# Patient Record
Sex: Male | Born: 1950 | Race: Black or African American | Hispanic: No | Marital: Married | Smoking: Current every day smoker
Health system: Southern US, Community
[De-identification: ages and names within clinical notes are randomized; demographics above are authoritative.]

## PROBLEM LIST (undated history)

## (undated) DIAGNOSIS — Z87891 Personal history of nicotine dependence: Secondary | ICD-10-CM

## (undated) DIAGNOSIS — I10 Essential (primary) hypertension: Secondary | ICD-10-CM

## (undated) HISTORY — PX: ABOVE KNEE LEG AMPUTATION: SUR20

## (undated) HISTORY — DX: Personal history of nicotine dependence: Z87.891

---

## 2011-03-21 ENCOUNTER — Emergency Department: Payer: Self-pay | Admitting: Emergency Medicine

## 2011-03-21 ENCOUNTER — Ambulatory Visit: Payer: Self-pay | Admitting: Family Medicine

## 2016-03-28 ENCOUNTER — Telehealth: Payer: Self-pay | Admitting: *Deleted

## 2016-03-28 NOTE — Telephone Encounter (Signed)
Received referral for initial lung cancer screening scan. Contacted patient and obtained smoking history as well as answering questions related to screening process. Patient is tentatively scheduled for shared decision making visit and CT scan on 04/01/16 at 1:30pm, pending insurance approval from business office. 

## 2016-03-31 ENCOUNTER — Other Ambulatory Visit: Payer: Self-pay | Admitting: Family Medicine

## 2016-03-31 ENCOUNTER — Encounter: Payer: Self-pay | Admitting: Family Medicine

## 2016-03-31 DIAGNOSIS — Z87891 Personal history of nicotine dependence: Secondary | ICD-10-CM

## 2016-03-31 HISTORY — DX: Personal history of nicotine dependence: Z87.891

## 2016-04-01 ENCOUNTER — Ambulatory Visit
Admission: RE | Admit: 2016-04-01 | Discharge: 2016-04-01 | Disposition: A | Discharge status: Home / Self Care | Payer: Medicaid Other | Source: Ambulatory Visit | Attending: Family Medicine | Admitting: Family Medicine

## 2016-04-01 ENCOUNTER — Inpatient Hospital Stay: Payer: Medicaid Other | Attending: Family Medicine | Admitting: Family Medicine

## 2016-04-01 ENCOUNTER — Encounter: Payer: Self-pay | Admitting: Family Medicine

## 2016-04-01 DIAGNOSIS — Z122 Encounter for screening for malignant neoplasm of respiratory organs: Secondary | ICD-10-CM

## 2016-04-01 DIAGNOSIS — N329 Bladder disorder, unspecified: Secondary | ICD-10-CM | POA: Insufficient documentation

## 2016-04-01 DIAGNOSIS — I2510 Atherosclerotic heart disease of native coronary artery without angina pectoris: Secondary | ICD-10-CM | POA: Insufficient documentation

## 2016-04-01 DIAGNOSIS — K760 Fatty (change of) liver, not elsewhere classified: Secondary | ICD-10-CM | POA: Diagnosis not present

## 2016-04-01 DIAGNOSIS — F17210 Nicotine dependence, cigarettes, uncomplicated: Secondary | ICD-10-CM

## 2016-04-01 DIAGNOSIS — Z87891 Personal history of nicotine dependence: Secondary | ICD-10-CM | POA: Insufficient documentation

## 2016-04-01 DIAGNOSIS — R918 Other nonspecific abnormal finding of lung field: Secondary | ICD-10-CM | POA: Insufficient documentation

## 2016-04-01 NOTE — Progress Notes (Signed)
In accordance with CMS guidelines, patient has meet eligibility criteria including age, absence of signs or symptoms of lung cancer, the specific calculation of cigarette smoking pack-years was 45 years and is a current smoker.   A shared decision-making session was conducted prior to the performance of CT scan. This includes one or more decision aids, includes benefits and harms of screening, follow-up diagnostic testing, over-diagnosis, false positive rate, and total radiation exposure.  Counseling on the importance of adherence to annual lung cancer LDCT screening, impact of co-morbidities, and ability or willingness to undergo diagnosis and treatment is imperative for compliance of the program.  Counseling on the importance of continued smoking cessation for former smokers; the importance of smoking cessation for current smokers and information about tobacco cessation interventions have been given to patient including the  at [facility], [address] [employer] [place], as well as Cancer Center specific smoking cessation programs.  Written order for lung cancer screening with LDCT has been given to the patient and any and all questions have been answered to the best of my abilities.   Yearly follow up will be scheduled by Shawn Perkins, Thoracic Navigator.   

## 2016-04-04 ENCOUNTER — Telehealth: Payer: Self-pay | Admitting: *Deleted

## 2016-04-04 NOTE — Telephone Encounter (Signed)
Notified patient of LDCT lung cancer screening results of Lung Rads 4A  Finding with recommendation for close follow up with either a PET scan or CT in the near future. Will discuss in multidisciplinary conference this week. Dr. Maryruth HancockSallie Patel aware and in agreement of plan. Also notified of incidental finding noted below. Patient verbalizes understanding.   IMPRESSION: 1. Lung-RADS Category 4A, suspicious. Follow up low-dose chest CT without contrast in 3 months (please use the following order, "CT CHEST LCS NODULE FOLLOW-UP W/O CM") is recommended. Alternatively, PET may be considered when there is a solid component 8mm or larger. Part-solid and solid left upper lobe pulmonary nodules as detailed above. 2. Atherosclerosis, including within the coronary arteries. 3. Apparent gastric wall thickening is at least partially due to underdistention. Correlate with any symptoms of gastritis or other gastric pathology. 4. Mild hepatic steatosis. These results will be called to the ordering clinician or representative by the Radiologist Assistant, and communication documented in the PACS or zVision Dashboard.

## 2016-04-07 ENCOUNTER — Telehealth: Payer: Self-pay | Admitting: *Deleted

## 2016-04-07 NOTE — Telephone Encounter (Signed)
Case reviewed in conference and consensus for 3 month follow up scan reviewed with Dr. Maryruth HancockSallie Patel. CT screening program will order and manage his follow up imaging. Reviewed plan with patient who is in agreement with plan.

## 2016-07-06 ENCOUNTER — Telehealth: Payer: Self-pay | Admitting: *Deleted

## 2016-07-06 NOTE — Telephone Encounter (Signed)
Left voicemail for patient notifyng them that it is time to schedule lung cancer screening CT scan follow up imaging. Instructed patient to anticipate call with appt and to call for any questions or problems with the appt.

## 2016-07-21 ENCOUNTER — Other Ambulatory Visit: Payer: Self-pay | Admitting: *Deleted

## 2016-07-21 DIAGNOSIS — R9389 Abnormal findings on diagnostic imaging of other specified body structures: Secondary | ICD-10-CM

## 2016-07-28 ENCOUNTER — Telehealth: Payer: Self-pay | Admitting: *Deleted

## 2016-07-28 NOTE — Telephone Encounter (Signed)
Short term follow up lung cancer screening low dose CT scan is due. Confirmed that patient is within the age range of 55-77, and asymptomatic, (no signs or symptoms of lung cancer). Patient denies illness that would prevent curative treatment for lung cancer if found. The patient is a current smoker, with a 46 pack year history. The shared decision making visit was done 04/01/16. Patient is agreeable for CT scan being scheduled.

## 2016-07-29 ENCOUNTER — Ambulatory Visit
Admission: RE | Admit: 2016-07-29 | Discharge: 2016-07-29 | Disposition: A | Discharge status: Home / Self Care | Payer: Medicaid Other | Source: Ambulatory Visit | Attending: Oncology | Admitting: Oncology

## 2016-07-29 DIAGNOSIS — R918 Other nonspecific abnormal finding of lung field: Secondary | ICD-10-CM | POA: Diagnosis not present

## 2016-07-29 DIAGNOSIS — Z09 Encounter for follow-up examination after completed treatment for conditions other than malignant neoplasm: Secondary | ICD-10-CM | POA: Insufficient documentation

## 2016-07-29 DIAGNOSIS — R938 Abnormal findings on diagnostic imaging of other specified body structures: Secondary | ICD-10-CM | POA: Diagnosis present

## 2016-07-29 DIAGNOSIS — I2510 Atherosclerotic heart disease of native coronary artery without angina pectoris: Secondary | ICD-10-CM | POA: Diagnosis not present

## 2016-07-29 DIAGNOSIS — R9389 Abnormal findings on diagnostic imaging of other specified body structures: Secondary | ICD-10-CM

## 2016-08-08 ENCOUNTER — Telehealth: Payer: Self-pay | Admitting: *Deleted

## 2016-08-08 NOTE — Telephone Encounter (Signed)
Notified patient of LDCT lung cancer screening results with recommendation for 12 month follow up imaging. Also notified of incidental finding noted below. Patient verbalizes understanding.   IMPRESSION: 1. Lung-RADS Category 2, benign appearance or behavior. Continue annual screening with low-dose chest CT without contrast in 12 months. 2. Coronary artery atherosclerosis.

## 2017-07-27 ENCOUNTER — Telehealth: Payer: Self-pay | Admitting: *Deleted

## 2017-07-27 DIAGNOSIS — Z87891 Personal history of nicotine dependence: Secondary | ICD-10-CM

## 2017-07-27 DIAGNOSIS — Z122 Encounter for screening for malignant neoplasm of respiratory organs: Secondary | ICD-10-CM

## 2017-07-27 NOTE — Telephone Encounter (Signed)
Notified patient that annual lung cancer screening low dose CT scan is due currently or will be in near future. Confirmed that patient is within the age range of 55-77, and asymptomatic, (no signs or symptoms of lung cancer). Patient denies illness that would prevent curative treatment for lung cancer if found. Verified smoking history, (current, 47 pack year). The shared decision making visit was done 04/01/16. Patient is agreeable for CT scan being scheduled.

## 2017-08-09 ENCOUNTER — Ambulatory Visit: Payer: Self-pay

## 2017-08-16 ENCOUNTER — Ambulatory Visit: Admission: RE | Admit: 2017-08-16 | Payer: Medicaid Other | Source: Ambulatory Visit

## 2017-08-22 ENCOUNTER — Telehealth: Payer: Self-pay | Admitting: *Deleted

## 2017-08-22 NOTE — Telephone Encounter (Signed)
Contacted patient regarding missed lung screening appointment. Patient desires to reschedule. Please see prior note regarding eligibility information.

## 2017-08-30 ENCOUNTER — Ambulatory Visit
Admission: RE | Admit: 2017-08-30 | Discharge: 2017-08-30 | Disposition: A | Discharge status: Home / Self Care | Payer: Medicaid Other | Source: Ambulatory Visit | Attending: Oncology | Admitting: Oncology

## 2017-08-30 DIAGNOSIS — J439 Emphysema, unspecified: Secondary | ICD-10-CM | POA: Insufficient documentation

## 2017-08-30 DIAGNOSIS — Z87891 Personal history of nicotine dependence: Secondary | ICD-10-CM

## 2017-08-30 DIAGNOSIS — Z122 Encounter for screening for malignant neoplasm of respiratory organs: Secondary | ICD-10-CM | POA: Diagnosis present

## 2017-08-30 DIAGNOSIS — I700 Atherosclerosis of aorta: Secondary | ICD-10-CM | POA: Diagnosis not present

## 2017-08-30 DIAGNOSIS — I2510 Atherosclerotic heart disease of native coronary artery without angina pectoris: Secondary | ICD-10-CM | POA: Insufficient documentation

## 2017-09-04 ENCOUNTER — Encounter: Payer: Self-pay | Admitting: *Deleted

## 2018-06-07 IMAGING — CT CT CHEST LUNG CANCER SCREENING LOW DOSE W/O CM
1 series · 14 of 33 positions shown, 18 images · non-contrast
Comparison: Low-dose lung cancer screening chest CT 07/29/2016.

CLINICAL DATA: 66-year-old male current smoker with 47 pack history
of smoking. Lung cancer screening examination.

EXAM:
CT CHEST WITHOUT CONTRAST LOW-DOSE FOR LUNG CANCER SCREENING
TECHNIQUE: Multidetector CT imaging of the chest was performed following the
standard protocol without IV contrast.

[Series 2: axial st · axial · 0.82mm/px · z∈[-615,-350]mm · 14 of 63 slices shown, 18 images]
[im 5/63  mediastinal]
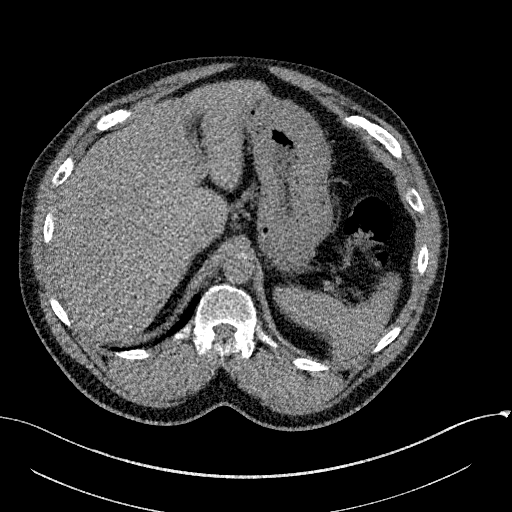
[im 5/63  lung]
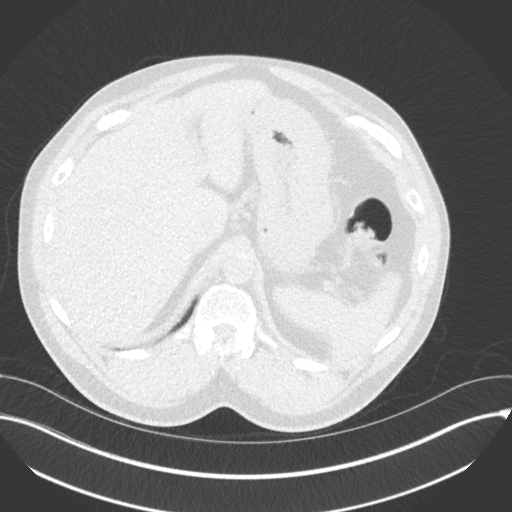
[im 10/63  lung]
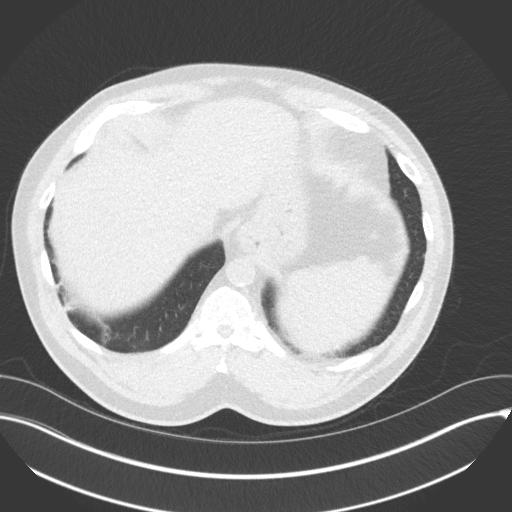
[im 13/63  lung]
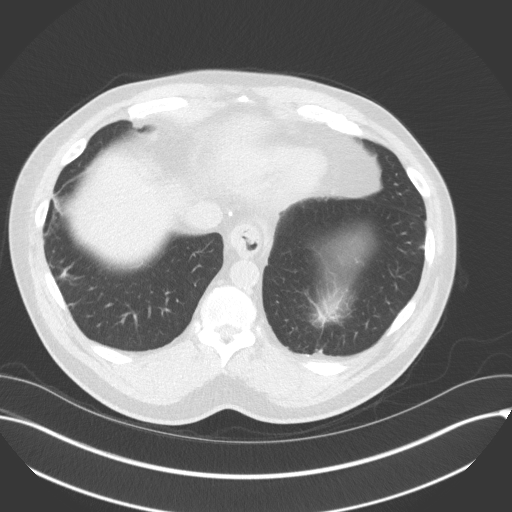
[im 17/63  lung]
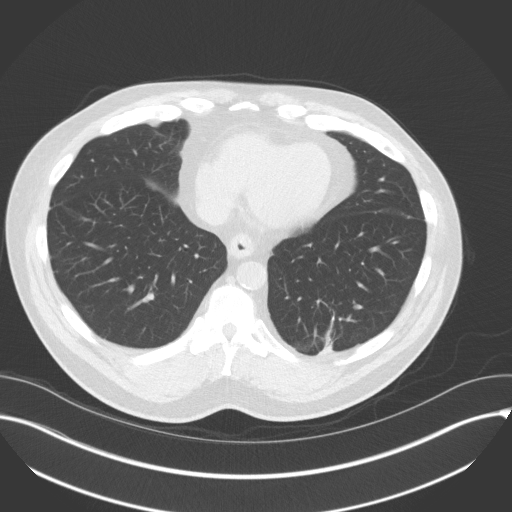
[im 21/63  mediastinal]
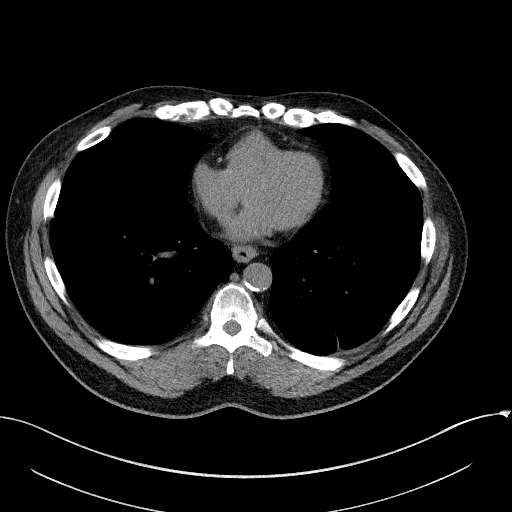
[im 21/63  lung]
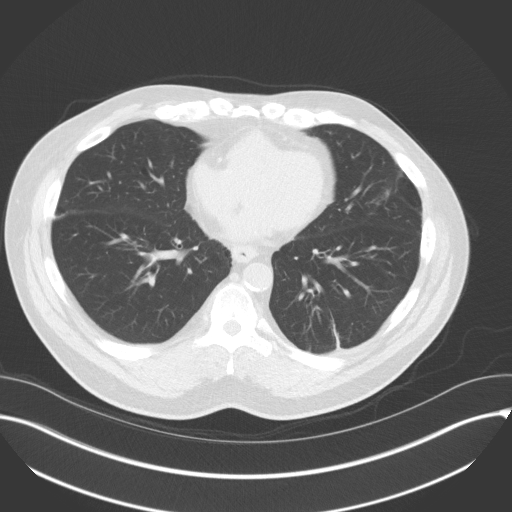
[im 26/63  lung]
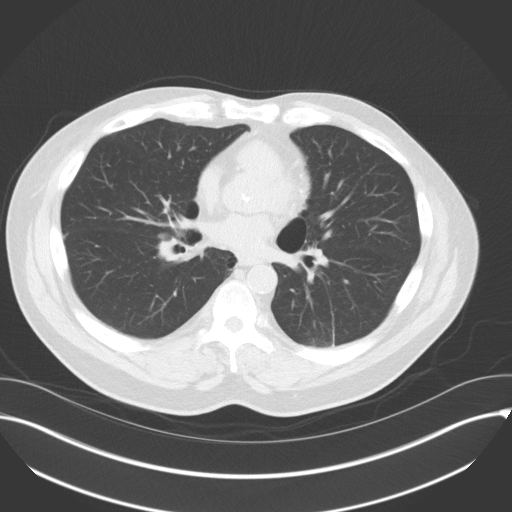
[im 30/63  lung]
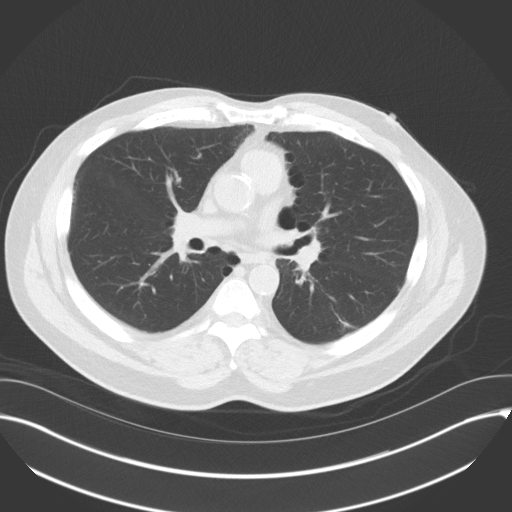
[im 34/63  lung]
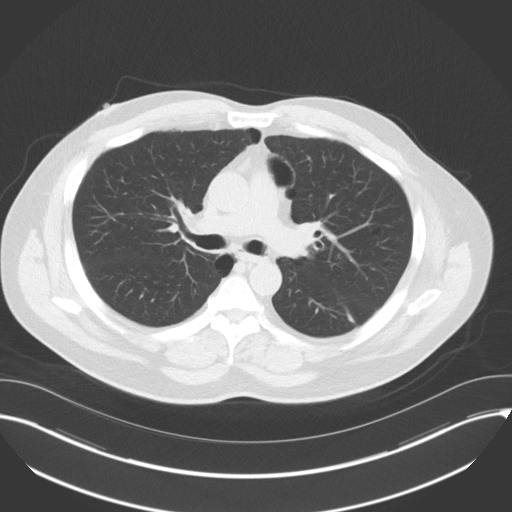
[im 37/63  mediastinal]
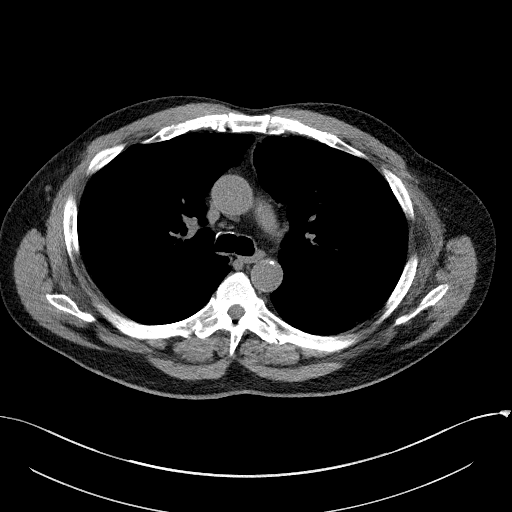
[im 37/63  lung]
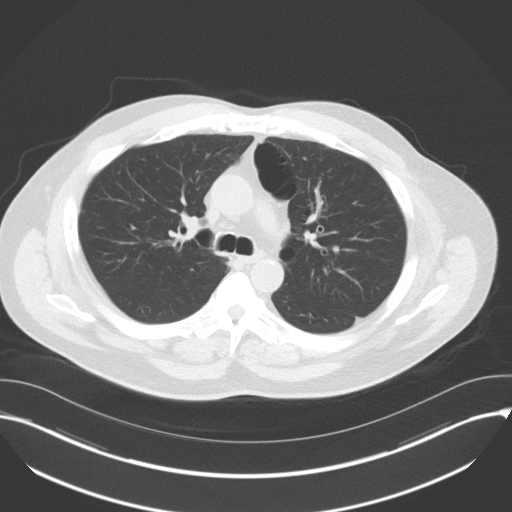
[im 42/63  lung]
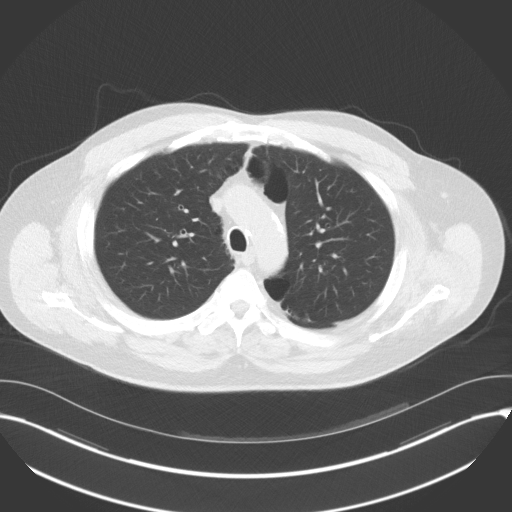
[im 46/63  lung]
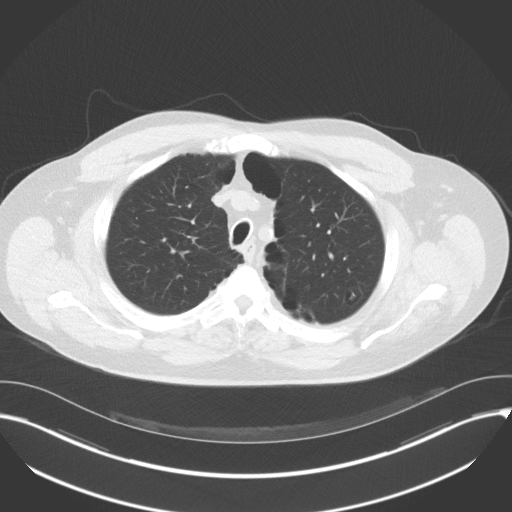
[im 50/63  lung]
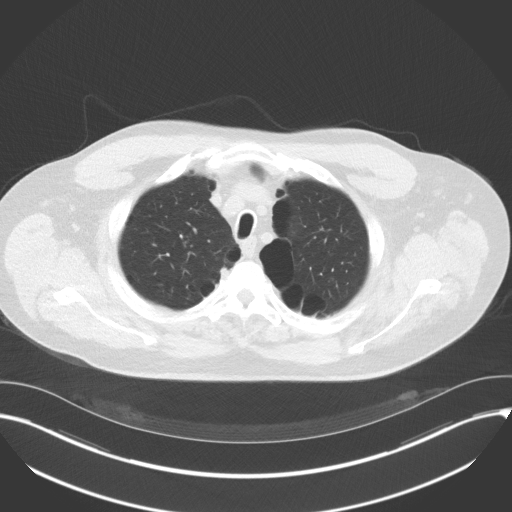
[im 53/63  mediastinal]
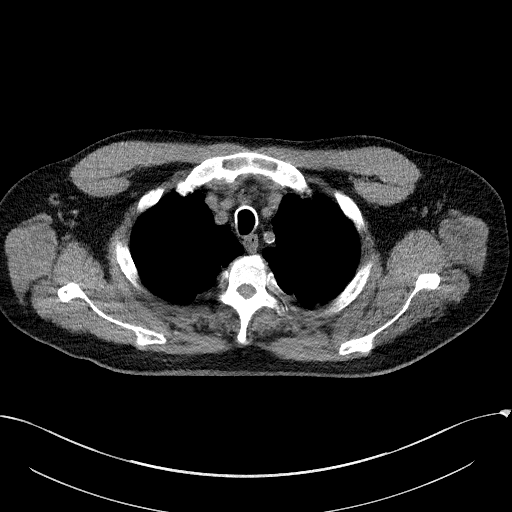
[im 53/63  lung]
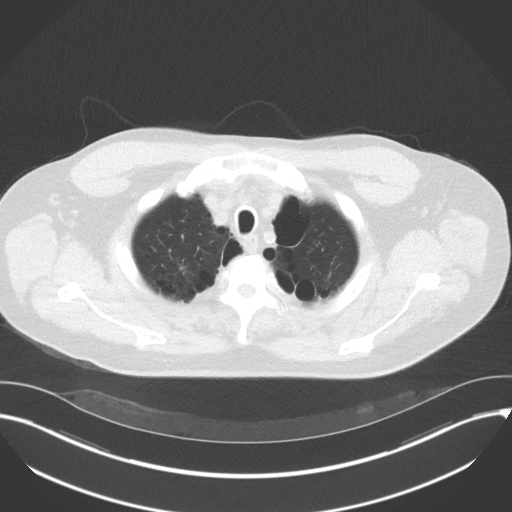
[im 58/63  lung]
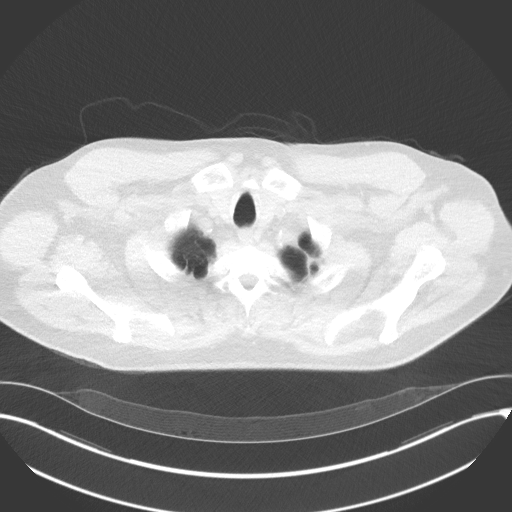

[14 of 33 positions shown; findings below may reference images not displayed]

FINDINGS: Cardiovascular: Heart size is normal. There is no significant
pericardial fluid, thickening or pericardial calcification. There is
aortic atherosclerosis, as well as atherosclerosis of the great
vessels of the mediastinum and the coronary arteries, including
calcified atherosclerotic plaque in the left main, left anterior
descending, left circumflex and right coronary arteries.
Calcifications of the aortic valve.

Mediastinum/Nodes: No pathologically enlarged mediastinal or hilar
lymph nodes. Please note that accurate exclusion of hilar adenopathy
is limited on noncontrast CT scans. Small hiatal hernia. No axillary
lymphadenopathy.

Lungs/Pleura: Multiple pulmonary nodules are again noted throughout
the lungs bilaterally, the largest of which is a mixed solid and sub
solid lesion in the left upper lobe (axial image 78 of series 3)
with a volume derived mean diameter of 15.3 mm for is the
ground-glass attenuation component and 5.7 mm for the solid
component. No other larger more suspicious appearing pulmonary
nodules or masses are noted. No acute consolidative airspace
disease. No pleural effusions. Diffuse bronchial wall thickening
with mild centrilobular and moderate paraseptal emphysema. Linear
scarring in the posterior aspect of the left lower lobe, and
throughout the periphery of the right lower lobe.

Upper Abdomen: Aortic atherosclerosis.

Musculoskeletal: There are no aggressive appearing lytic or blastic
lesions noted in the visualized portions of the skeleton.
IMPRESSION: 1. Lung-RADS 2S, benign appearance or behavior. Continue annual
screening with low-dose chest CT without contrast in 12 months.
2. The "S" modifier above refers to potentially clinically
significant non lung cancer related findings. Specifically, there is
aortic atherosclerosis, in addition to left main and 3 vessel
coronary artery disease. Please note that although the presence of
coronary artery calcium documents the presence of coronary artery
disease, the severity of this disease and any potential stenosis
cannot be assessed on this non-gated CT examination. Assessment for
potential risk factor modification, dietary therapy or pharmacologic
therapy may be warranted, if clinically indicated.
3. Mild diffuse bronchial thickening with mild centrilobular and
moderate paraseptal emphysema; imaging findings suggestive of
underlying COPD.
4. There are calcifications of the aortic valve. Echocardiographic
correlation for evaluation of potential valvular dysfunction may be
warranted if clinically indicated.

Aortic Atherosclerosis (PIKGI-ABK.K) and Emphysema (PIKGI-RCG.F).

## 2018-08-21 ENCOUNTER — Telehealth: Payer: Self-pay | Admitting: *Deleted

## 2018-08-21 ENCOUNTER — Encounter: Payer: Self-pay | Admitting: *Deleted

## 2018-08-21 DIAGNOSIS — Z122 Encounter for screening for malignant neoplasm of respiratory organs: Secondary | ICD-10-CM

## 2018-08-21 NOTE — Telephone Encounter (Signed)
Patient has been notified that the annual lung cancer screening low dose CT scan is due currently or will be in the near future.  Confirmed that the patient is within the age range of 2-80, and asymptomatic, and currently exhibits no signs or symptoms of lung cancer.  Patient denies illness that would prevent curative treatment for lung cancer if found.  Verified smoking history, 53 pkyr history current smoker 1 ppd .  The shared decision making visit was completed on 04-01-16.  Patient is agreeable for the CT scan to be scheduled.  Will call patient back with date and time of appointment.

## 2018-08-22 ENCOUNTER — Telehealth: Payer: Self-pay | Admitting: *Deleted

## 2018-08-22 NOTE — Telephone Encounter (Signed)
Called patient r/t appt for ldct screening on Wednesday 09/05/2018 @ 11:15am here @ [facility], unable to leave a message for patient and appt mailed to patient.

## 2018-09-05 ENCOUNTER — Ambulatory Visit: Payer: Medicare Other | Attending: Oncology

## 2018-09-12 ENCOUNTER — Telehealth: Payer: Self-pay | Admitting: *Deleted

## 2018-09-12 NOTE — Telephone Encounter (Signed)
Attempted to reschedule no show lung screening appt. However, patient did not answer and there is no voicemail option.

## 2018-09-13 ENCOUNTER — Telehealth: Payer: Self-pay | Admitting: *Deleted

## 2018-09-13 ENCOUNTER — Encounter: Payer: Self-pay | Admitting: *Deleted

## 2018-09-13 NOTE — Telephone Encounter (Signed)
Attempted to contact patient r/t LDCT Screening follow up due at this time. Attempted to contact patient r/t LDCT Screening follow up due at this time.  No answer received, unable to leave message at this time, will attempt contact at later date.   

## 2021-07-16 ENCOUNTER — Ambulatory Visit: Payer: Medicare (Managed Care) | Attending: Family Medicine

## 2021-07-16 ENCOUNTER — Other Ambulatory Visit: Payer: Self-pay

## 2021-07-16 DIAGNOSIS — R2681 Unsteadiness on feet: Secondary | ICD-10-CM | POA: Insufficient documentation

## 2021-07-16 DIAGNOSIS — Z89611 Acquired absence of right leg above knee: Secondary | ICD-10-CM | POA: Insufficient documentation

## 2021-07-16 DIAGNOSIS — Z89612 Acquired absence of left leg above knee: Secondary | ICD-10-CM | POA: Diagnosis present

## 2021-07-16 DIAGNOSIS — M6281 Muscle weakness (generalized): Secondary | ICD-10-CM | POA: Insufficient documentation

## 2021-07-16 DIAGNOSIS — R2689 Other abnormalities of gait and mobility: Secondary | ICD-10-CM | POA: Insufficient documentation

## 2021-07-16 NOTE — Therapy (Signed)
[place] MAIN [facility] SERVICES [address] [place], [place], [address] Phone: [phone]   Fax:  [phone]  Physical Therapy Evaluation  Patient Details  Name: Derek Gilbert MRN: [id number] Date of Birth: 1951-06-21 Referring Provider (PT): Elba Barman, MD  Encounter Date: 07/16/2021   PT End of Session - 07/16/21 1758     Visit Number 1    Number of Visits 1    Date for PT Re-Evaluation 07/16/21    PT Start Time 0949    PT Stop Time 1030    PT Time Calculation (min) 41 min    Activity Tolerance Patient tolerated treatment well    Behavior During Therapy [facility] for tasks assessed/performed             Past Medical History:  Diagnosis Date   Personal history of tobacco use, presenting hazards to health 03/31/2016    History reviewed. No pertinent surgical history.  There were no vitals filed for this visit.    Subjective Assessment - 07/16/21 0952     Subjective Pt is a pleasant 70 y/o male presenting for power wheelchair evaluation. 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 Pt had a stroke following one of the amputations while he was still in the hospital that resulted in difficulty with his speech and LHP.  Pt did receive physical therapy in the past as well as LE prostheses, but reports he was unsafe when he attempted to ambulate with the prosthetic legs and that he had difficulty with the equipment locking system. He has not used LE prosthetics in years and reports overall decline in his strength and mobility. Pt has also not stood or ambulated in several years. He reports he was issued a power wheelchair >6 years ago, and that he was able to successfully and safely use it to navigate his home and to complete ADLs/iADLs. Pt reports around 8 months ago his powerchair "broke down," and no longer works d/t equipment/electronics failures. He has been using a manual wheelchair since, but reports increased  difficulty navigating his home environment safely. His wife also reports she must now "push" him in the manual wheelchair as the pt does not have the endurance to propel the manual chair through his house. Pt reports since he started using the manual wheelchair, he has experienced several falls, including 4 in the last 6 months. These falls primarily occur when he attempts to transfer to/from his manual wheelchair. Pt has poor UE endurance and reports LUE pain when using LUE to propel chair. Due to decreased strength/endurance and fatigue he is not always able to make it to his bedside commode on time or safely transfer to it and has accidents of bowel/bladder. He reports he did not have this issue with power chair. He reports pain on his bottom/around sacrum from prolonged sitting in the manual wheelchair with a history of recurring sores on his "tailbone."    Patient is accompained by: Family member   pt's wife present at eval   Pertinent History Pt is a pleasant 70 y/o male presenting to wheelchair evaluation. Pt with B AKA (d/t PVD/clotting per chart) and hx of CVA with left-side hemiparesis. Pt presents in manual wheelchair. He reports he was using a power wheelchair previously but that it broke down as of 8 months ago, d/t electronics failure and charger no longer working.  Other PMH per chart includes HTN, pulmonary embolus (2010), crack cocaine use, PVD, glaucoma with  hx of retinal detachment (2013), shoulder pain, rhabdo (2018), and acute renal insufficiency.    Limitations Sitting;Standing;House hold activities;Walking    How long can you sit comfortably? In manual chair pt able to sit 3-4 hours, but then must lie down d/t fatigue, reports pain around sacrum with sitting in manual wheelchair (hx of wounds).    How long can you stand comfortably? Pt unable to stand    How long can you walk comfortably? Pt unable to walk (B AKA).    Diagnostic tests no recent pertinent diagnostics    Patient Stated  Goals Pt would like a new power wheelchair so he can return to safe mobility in his home    Currently in Pain? No/denies            PATIENT INFORMATION: This Evaluation form will serve as the LMN for the following suppliers:  Supplier: [employer] Contact Person: Yvone Neu Phone: [phone]   Reason for Referral: Patient/caregiver Goals: Patient was seen for face-to-face evaluation for new power wheelchair.  Also present was    Luz Brazen to discuss recommendations and wheelchair options.  Further paperwork was completed and sent to vendor.  Patient appears to qualify for power mobility device at this time per objective findings.   MEDICAL HISTORY: B AKA with hx of CVA with LHP. Other PMH includes HTN, hypercholesterolemia, pulmonary embolus (2010), PVD, B AKA d/t PVD/clotting, glaucoma (hx of retinal detachment 2013), shoulder pain, rhabdo (2018), crack cocaine use, acute renal insufficiency Diagnosis: B AKA with hx of CVA with LHP Primary Diagnosis Onset: Per pt B above knee amputations occurred 9-10 years ago, reports CVA occurred following amputation while still in hospital [x]Progressive Disease B AKA d/t PVD/clotting Relevant Past and Future Surgeries: reports B AKA 9-10 years ago Height: per chart 3'8" Weight: per chart 160 lbs Explain and recent changes or trends in weight:   Relevant History including falls:  Pt is a pleasant 70 y/o male presenting for power wheelchair evaluation. 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 Pt had a stroke following one of the amputations while he was still in the hospital that resulted in difficulty with his speech and LHP.  Pt did receive physical therapy in the past as well as LE prostheses, but reports he was unsafe when he attempted to ambulate with the prosthetic legs and that he had difficulty with the equipment locking system. He has not used LE prosthetics in years and reports overall decline in his  strength and mobility. Pt has also not stood or ambulated in several years. He reports he was issued a power wheelchair >6 years ago, and that he was able to successfully and safely use it to navigate his home and to complete ADLs/iADLs. Pt reports around 8 months ago his powerchair "broke down," and no longer works d/t equipment/electronics failures. He has been using a manual wheelchair since, but reports increased difficulty navigating his home environment safely. His wife also reports she must now "push" him in the manual wheelchair as the pt does not have the endurance to propel the manual chair through his house. Pt reports since he started using the manual wheelchair, he has experienced several falls, including 4 in the last 6 months. These falls primarily occur when he attempts to transfer to/from his manual wheelchair. Pt has poor UE endurance and reports LUE pain when using LUE to propel chair. Due to decreased strength/endurance and fatigue he is not always able to make it  to his bedside commode on time or safely transfer to it and has accidents of bowel/bladder. He reports he did not have this issue with power chair. He reports pain on his bottom/around sacrum from prolonged sitting in the manual wheelchair with a history of recurring sores on his "tailbone."    HOME ENVIRONMENT: [x]House  []Condo/town home  []Apartment  []Assisted Living    []Lives Alone [x] Lives with Others                                                    Hours with caregiver: lives with his wife who is his caregiver  [x]Home is accessible to patient            Stairs  []Yes [] No     Ramp [x]Yes []No Comments:  Ramp entrance in front of house, was able to successfully use previous power WC in his home   COMMUNITY ADL: TRANSPORTATION: [x]Car    []Van    []Public Transportation    []Adapted w/c Lift   []Ambulance   []Other:       []Sits in wheelchair during transport  Employment/School:   on disability, needs  transport for doctor's appointments, participating in the community  Specific requirements pertaining to mobility                                                     Other: Spouse reports they have "regular car" for transportation. Spouse drives. She reports she transfers pt in and out of car.                   FUNCTIONAL/SENSORY PROCESSING SKILLS:  Handedness:   [x]Right     []Left    []NA  Comments:                                 Media planner for Wheeled Mobility [x]Processing Skills are adequate for safe wheelchair operation  Areas of concern than may interfere with safe operation of wheelchair Description of problem   [] Attention to environment     []Judgment     [] Hearing  [x] Vision or visual processing    []Motor Planning  [] Fluctuations in Behavior                                                Pt reports he does need glasses for reading, but this does not interfere with ability to safely use WC and has not interfered in the past with using previous power WC   VERBAL COMMUNICATION: [x]WFL receptive [x] WFL expressive []Understandable  [x]Difficult to understand  []non-communicative [] Uses an augmented communication device   Pt is somtimes difficult to understand. His speech was affected by stroke. However, pt ability to understand/express himself is WNL.    CURRENT SEATING / MOBILITY: Current Mobility Base:   []None  []Dependent  [x]Manual  []Scooter  []Power   Type of Control:  attempts to self-propel manual  chair, but with difficulty and reports LUE pain in elbow/UE fatigue. Pt's wife now "pushes him" in chair to assist him navigating their home.                 Manufacturer:                         Size:                         Age:                           Current Condition of Mobility Base:                                                                                                                     Current Wheelchair components: See  comment above for detail. Pt had power chair previously (reports >25 years old), but it is no longer functional/broke down 8 months ago. Currently attempts to use manual wheelchair. Manual wheelchair with nonfunctioning breaks, insufficient for pt to perform pressure relief to prevent recurrent pressure wounds. Current manual chair unsafe regarding fit and d/t pt poor endurance/UE pain with use. Manual chair limits pt's ability to independently and safely navigate his home, and inability to perform proper pressure relief places pt at increased risk of hospitalization d/t skin wounds.   Describe posture in present seating system:    sacral sitting, increased forward head posture, pt appears at risk for sliding out of chair and reports hx of falls from manual chair with transfers                                                                     SENSATION and SKIN ISSUES: Sensation [x]Intact []Impaired []Absent   Level of sensation:                           Pressure Relief: Able to perform effective pressure relief :   []Yes  [x] No Method:                                                                              If not, Why?:  Pt with insufficient UE endurance for maintaining pressure relief position for long enough time, as evidenced by hx of recurrent skin breakdown with wounds developing on his sacrum  He reports this was not an issue when he had previous power WC.                                                                Skin Issues/Skin Integrity Current Skin Issues   []Yes [x]No  []Intact [] Red area [] Open Area  []Scar Tissue [x]At risk from prolonged sitting  Where hx of wounds on sacrum                         History of Skin Issues   [x]Yes []No  Where    sacrum                                     When  within past several months per pt and pt's spouse                                             Hx of skin flap surgeries []Yes []No  Where                  When                                                   Limited sitting tolerance [x]Yes, limited secondary to fatigue, pressure/pain on bottom/wound risk []No Hours spent sitting in wheelchair daily:                                                         Complaint of Pain:  Please describe:  Pt reports pain on his bottom from sitting in manual wheelchair (reports hx of wounds), reports L elbow pain with propelling WC/using LUE, pt reports phantom pain (feels pain in B feet), he transfers out of chair after 3-4 hours to lie down                                                                                                           Swelling/Edema:        none per observation  ADL STATUS (in reference to wheelchair use):  Indep Assist Unable Indep with Equip Not assessed Comments  Dressing       *mod I   x                                                Pt reports he is able to do without assist from spouse, but that it takes him more time          Eating  x                                                                  Indep with self-feeding but does require assist for meal prep                                                          Toileting                     x                                              Uses bedside commode.       Unsafe in current manual chair, reports falls when attempting transfers, is unable to make it to commode on time                                                       Bathing                  x                                                  Bathes in the kitchen, pt states "my arms slide a lot when trying to take a bath"                                                                   Grooming/ Hygiene                 x  Meal Prep                  x                                                                                                         IADLS                   x                                                                                               Bowel Management: []Continent  []Incontinent  [x]Accidents Comments:  Due to safety issues/difficulty transferring from manual chair and propelling manual WC, pt not always able to make it to commode on time, increasing risk for wounds/infection and hospitalization                                                Bladder Management: []Continent  []Incontinent  [x]Accidents Comments: Due to safety issues/difficulty transferring from manual chair and propelling manual WC, pt not always able to make it to commode on time, increasing risk for wounds/infection and hospitalization                                                WHEELCHAIR SKILLS: Manual w/c Propulsion: []UE or LE strength and endurance sufficient to participate in ADLs using manual wheelchair Arm :  []left []right  []Both                                   Foot:   []left []right  []Both  Distance: Limited in his home. Pt with insufficient UE strength/endurance for manual propulsion, wife is helping him/pushing chair. Also reports chronic LUE pain with use of LUE. Pt with B AKA, so does not have ability to utilize lower body for WC propulsion.   Operate Scooter: [] Strength, hand grip, balance and transfer appropriate for use []Living environment is accessible for use of scooter  Operate Power w/c:  [x] Std. Joystick   [] Alternative Controls Indep [x] Assist [] Dependent/ Unable [] N/A [] []Safe          [] Functional      Distance:                Bed confined without wheelchair [x]  Yes [] No   STRENGTH/RANGE OF MOTION:  Range of Motion Strength  Shoulder                                Impaired, achieves approx. 105-115 deg. Abduction/flexion B                          4-/5 B                                   Elbow                        WFL                                  4/5     B; painful LUE                                                 Wrist/Hand                     WFL                                                           4/5     B                                                    Hip     Unable to formally assess as pt is unable to stand d/t B AKA. Hip flexion ROM appears WFL                                                                      4-/5  B                                                Knee        N/a d/t B AKA                                                        Above knee amputation  Ankle N/a d/t B AKA                       Above knee amputation                                                 MOBILITY/BALANCE:  [] Patient is totally dependent for mobility                                                                                               Balance Transfers Ambulation  Sitting Balance: Standing Balance: [] Independent [] Independent/Modified Independent  [] WFL     [] [facility] [] Supervision [] Supervision  [x] Uses UEs to assist with seated balance, PT provides close CGA when assessing seated balance as pt with poor UE/trunk strength that increases risk of fall from [facility]  [] Supervision [] Min Assist [] Ambulates with Assist                           [] Min Assist [] Min assist [] Mod Assist [] Ambulates with Device:  [] RW   [] StW   [] Cane   []                [] Mod Assist [] Mod assist [x] Max assist;  Not formally tested, however, per reports insufficient UE strength for safe mod I transfers. Unsafe transferring from manual WC, has fallen 4x in past 6 months attempting transfers. Pt wife helps pt transfer into/out of car   [] Max Assist [] Max assist [] Dependent [] Indep. Short Distance Only  [] Unable [x] Unable [] Lift / Sling Required Distance (in feet)                             [] Sliding  board [x] Unable to Ambulate: (Explain: PT B AKA. Reports in past did have LE prosthetics, but were not safe/he was unsafe when attempting to use, reports did not lock properly. Now pt with poor hip/LE strength. Has not used LE prosthetics for 2-3 years, has not stood or walked in 2-3 years  Cardio Status:  []Intact  [] Impaired   [x] NA                              Respiratory Status:  []Intact   []Impaired   [x]NA                                     Orthotics/Prosthetics:                     does not use (has not used in 2-3 years), pt reports he was not able to ambulate with them  safely and that they did not "lock" correctly  Comments (Address manual vs power w/c vs scooter):       Pt is a pleasant 70 y/o male with hx of B AKA and CVA with L side weakness. Pt has not stood or ambulated in 2-3 years, and reports decline in strength and endurance. Pt with previous power wheelchair that he was successfully using to safely navigate his home up until 8 months ago when his power chair broke down d/t Tour manager failure. Pt now uses manual wheelchair, but his wife must help "push" him throughout their home. The manual wheelchair has also proven to be an unsafe option for the pt, as the pt has insufficient UE strength/endurance and reports LUE pain with trying to propel the manual wheelchair in his home. Manual wheelchair does not provide enough safety features, as pt also has impaired seated balance and shows increased reliance on UEs to maintain balance in his WC with testing. He has fallen 4 times in the last 6 months from the manual wheelchair when attempting transfers, limiting his ability to safely perform ADLs. Overall, he has insufficient upper body strength for proper pressure-relief techniques in the manual WC. This has resulted in recurring wounds developing on his sacrum, placing him at increased risk for hospitalization and poor outcomes. Due to difficulty with propelling the manual WC, pt  has been having difficulty making it to his commode on time and reports accidents of bowel and bladder, which also increases risk for skin wounds, infection and future hospitalization. He reports he did not have this problem when he had a power WC as he was able to make it to his commode on time and safely transfer. D/t difficulty with pressure-relief techniques pt has pain on his bottom which limits his time he can use his current manual wheelchair. Poor UE endurance and LUE elbow pain also limit his ability to propel his WC. Due to the nature of the pt's condition, hx of multiple falls from his manual WC, poor overall strength, endurance, hx of wounds, and pain, the pt will require a power chair.                                                             Anterior / Posterior Obliquity Rotation-Pelvis  PELVIS    []Neutral  [x] Posterior  [] Anterior     [x]WFL  []Right Elevated  []Left Elevated   [x]WFL  []Right Anterior []  Left Anterior    [] Fixed [] Partly Flexible [x] Flexible  [] Other  [] Fixed  [x] Partly Flexible  [] Flexible [] Other  [] Fixed  [x] Partly Flexible  [] Flexible [] Other  TRUNK []WFL [x]Thoracic Kyphosis []Lumbar Lordosis   [x] WFL []Convex Right []Convex Left   []c-curve []s-curve []multiple  [x] Neutral [] Left-anterior [] Right-anterior    [] Fixed [] Flexible [x] Partly Flexible       Other  [] Fixed [] Flexible [x] Partly Flexible [] Other  [] Fixed           [] Flexible [x] Partly Flexible [] Other   Position Windswept   HIPS  [x] Neutral [] Abduct [] ADduct [x] Neutral [] Right [] Left       [] Fixed  [  x] Partly Flexible             [] Dislocated [] Flexible [] Subluxed    [] Fixed [x] Partly Flexible  [] Flexible [] Other              Foot Positioning Knee Positioning   Knees and  Feet  [] [facility] []Left []Right N/A pt with B AKA [] WFL []Left []Right   KNEES ROM concerns: ROM  concerns:   & Dorsi-Flexed                    []Lt []Rt                                  FEET Plantar Flexed                  []Lt []Rt     Inversion                    []Lt []Rt     Eversion                    []Lt []Rt    HEAD [x] Functional [x] Good Head Control   & [x] Flexed         [] Extended [] Adequate Head Control   NECK [] Rotated  Lt  [] Lat Flexed Lt [] Rotated  Rt [] Lat Flexed Rt [] Limited Head Control    [] Cervical Hyperextension [] Absent  Head Control    SHOULDERS ELBOWS WRIST& HAND         Left     Right    Left     Right  U/E []Functional  Left            []Functional  Right      [facility]                [facility]           []Fisting             []Fisting     [x]elevated Left []depressed  Left [x]elevated Right []depressed  Right   wfl   []protracted Left []retracted Left []protracted Right []retracted Right []subluxed  Left              []subluxed  Right         Goals for Wheelchair Mobility  [x] Independence with mobility in the home with motor related ADLs (MRADLs)  [x] Independence with MRADLs in the community [] Provide dependent mobility  [] Provide recline     [x]Provide tilt   Goals for Seating system [x] Optimize pressure distribution [x] Provide support needed to facilitate function or safety [] Provide corrective forces to assist with maintaining or improving posture [] Accommodate client's posture: current seated postures and positions are not flexible or will not tolerate corrective forces [x] Client to be independent with relieving pressure in the wheelchair [x]Enhance physiological function such as breathing, swallowing, digestion  Simulation ideas/Equipment trials:                                   State why other equipment was  unsuccessful:                      Pt is a pleasant  70 y/o male with hx of B AKA and CVA with L side weakness per chart. Pt has not stood or ambulated in 2-3 years, and reports decline in strength and endurance. Pt with  previous power wheelchair that he was successfully using to safely navigate his home up until 8 months ago when his power chair broke down d/t Tour manager failure. Pt now uses manual wheelchair, but his wife must help "push" him throughout their home. The manual wheelchair has also proven to be an unsafe option for the pt, as the pt has insufficient UE strength/endurance and reports LUE pain with trying to propel the manual wheelchair in his home. Manual wheelchair does not provide enough safety features, as pt also has impaired seated balance and shows increased reliance on UEs to maintain balance in his WC with testing. He has fallen 4 times in the last 6 months from the manual wheelchair when attempting transfers, limiting his ability to safely perform ADLs. Overall, he has insufficient upper body strength for proper pressure-relief techniques in the manual WC. This has resulted in recurring wounds developing on his sacrum, placing him at increased risk for hospitalization and poor outcomes. Due to difficulty with propelling the manual WC, pt has been having difficulty making it to his commode on time and reports accidents of bowel and bladder, which also increases risk for skin wounds, infection and future hospitalization. He reports he did not have this problem when he had a power WC as he was able to make it to his commode on time and safely transfer. D/t difficulty with pressure-relief techniques pt has pain on his bottom which limits his time he can use his current manual wheelchair. Poor UE endurance and LUE elbow pain also limit his ability to propel his WC. Due to the nature of the pt's condition, hx of multiple falls from his manual WC, poor overall strength, endurance, hx of wounds, and pain, the pt will require a Clinical biochemist.  Pt will require use of a tilt function for sufficient pressure relief and rest periods in order to reduce risk of wound development and decrease pain  that currently limits his mobility. Pt will need a synergy back for improved postural control, lateral and posterior trunk support, as well as lumbar/sacral support in order to relieve pressure of sacrum and spinal processes. Pt requires a solution skin protection and positioning seat cushion d/t history of pressure ulceration and to prevent pelvic extension. A headrest will be necessary when pt uses tilt function for pressure relief d/t pt poor overall strength of postural musculature and for safety/neck support. Adjustable height, flip back armrests with full length pads will provide support for improved balance and will allow pt to safely transfer to/from his WC. A pelvic belt will ensure the pt will not fall out of the power chair. In summary, the pt is at high risk for falls and will require a Quantum J4 power WC to decrease fall risk, decrease risk of wound development, and to increase ease and safety with mobility and ADLs in his home.                                                             MOBILITY BASE RECOMMENDATIONS and JUSTIFICATION: MOBILITY COMPONENT JUSTIFICATION  Manufacturer: Quantum  Model: Edge 3      Size: Width 16    Seat Depth 18       [x]provide transport from point A to B [x]promote Indep mobility  [x]is not a safe, functional ambulator [x]walker or cane inadequate []non-standard width/depth necessary to accommodate anatomical measurement []                            []Manual Mobility Base []non-functional ambulator    []Scooter/POV  []can safely operate  []can safely transfer   []has adequate trunk stability  []cannot functionally propel manual w/c  [x]Power Mobility Base  [x]non-ambulatory  [x]cannot functionally propel manual wheelchair  [x] cannot functionally and safely operate scooter/POV [x]can safely operate and willing to  []Stroller Base []infant/child  []unable to propel manual wheelchair []allows for growth []non-functional  ambulator []non-functional UE []Indep mobility is not a goal at this time  [x]Tilt  []Forward                   [x]Backward                  [x]Powered tilt              []Manual tilt  [x]change position against gravitational force on head and shoulders  [x]change position for pressure relief/cannot weight shift []transfers  []management of tone [x]rest periods []control edema [x]facilitate postural control  []                                      []Recline  []Power recline on power base []Manual recline on manual base  []accommodate femur to back angle  []bring to full recline for ADL care  []change position for pressure relief/cannot weight shift []rest periods []repositioning for transfers or clothing/diaper /catheter changes []head positioning  []Lighter weight required []self- propulsion  []lifting []                                                []Heavy Duty required []user weight greater than 250# []extreme tone/ over active movement []broken frame on previous chair []                                    [x] Back synergy back [] Angle Adjustable [] Custom molded                           [x]postural control []control of tone/spasticity []accommodation of range of motion []UE functional control []accommodation for seating system []                                         [x]provide lateral trunk support []accommodate deformity [x]provide posterior trunk support [x]provide lumbar/sacral support [x]support trunk in midline [x]Pressure relief over spinal processes  [x] Seat Cushion solution skin protection and positioning                        []impaired sensation  []decubitus ulcers present [x]history of pressure ulceration [  x]prevent pelvic extension []low maintenance  [x]stabilize pelvis  []accommodate obliquity []accommodate multiple deformity [x]neutralize lower extremity position [x]increase pressure distribution []                                           [] Pelvic/thigh support  [] Lateral thigh guide [] Distal medial pad  [] Distal lateral pad [] pelvis in neutral []accommodate pelvis [] position upper legs [] alignment [] accommodate ROM [] decrease adduction []accommodate tone []removable for transfers []decrease abduction  [] Lateral trunk Supports [] Lt     [] Rt []decrease lateral trunk leaning []control tone []contour for increased contact []safety  []accommodate asymmetry []                                                [x] Mounting hardware  []lateral trunk supports  []back   []seat [x]headrest      [] thigh support []fixed   []swing away []attach seat platform/cushion to w/c frame []attach back cushion to w/c frame []mount postural supports [x]mount headrest  []swing medial thigh support away []swing lateral supports away for transfers  []                                                    Armrests  []fixed [x]adjustable height []removable   []swing away  [x]flip back   []reclining [x]full length pads []desk    []pads tubular  [x]provide support with elbow at 90   []provide support for w/c tray [x]change of height/angles for variable activities [x]remove for transfers []allow to come closer to table top []remove for access to tables []                                              Hangers/ Leg rests  []60 []70 []90 []elevating []heavy duty  []articulating []fixed []lift off []swing away     []power []provide LE support  []accommodate to hamstring tightness []elevate legs during recline   []provide change in position for Legs []Maintain placement of feet on footplate []durability []enable transfers []decrease edema []Accommodate lower leg length []                                        Foot support Footplate    []Lt  [] Rt  [] Center mount []flip up                            []depth/angle adjustable []Amputee adapter    [] Lt     [] Rt []provide foot support []accommodate to ankle  ROM []transfers []Provide support for residual extremity [] allow foot to go under wheelchair base [] decrease tone  []                                                []  Ankle strap/heel loops []support foot on foot support []decrease extraneous movement []provide input to heel  []protect foot  Tires: []pneumatic  [x]flat free inserts  []solid  [x]decrease maintenance  [x]prevent frequent flats []increase shock absorbency []decrease pain from road shock []decrease spasms from road shock []                                             [x] Headrest  [x]provide posterior head support [x]provide posterior neck support []provide lateral head support []provide anterior head support [x]support during tilt and recline []improve feeding   []improve respiration []placement of switches [x]safety  []accommodate ROM  []accommodate tone []improve visual orientation  [] Anterior chest strap [] Vest [] Shoulder retractors  []decrease forward movement of shoulder []accommodation of TLSO []decrease forward movement of trunk []decrease shoulder elevation []added abdominal support []alignment []assistance with shoulder control  []                                              Pelvic Positioner [x]Belt []SubASIS bar []Dual Pull []stabilize tone [x]decrease falling out of chair/ **will not Decrease potential for sliding due to pelvic tilting []prevent excessive rotation []pad for protection over boney prominence []prominence comfort []special pull angle to control rotation []                                                 Upper ExtremitySupport  []L   [] R []Arm trough   []hand support [] tray       []full tray []swivel mount []decrease edema      []decrease subluxation   []control tone   []placement for AAC/Computer/EADL []decrease gravitational pull on shoulders []provide midline positioning []provide support to increase UE function []provide hand support in natural  position []provide work surface   POWER WHEELCHAIR CONTROLS  [x]Proportional  []Non-Proportional Type               joy stick                        []Left  [x]Right [x]provides access for controlling wheelchair   []lacks motor control to operate proportional drive control []unable to understand proportional controls  Actuator Control Module  [x]Single  []Multiple   [x]Allow the client to operate the power seat function(s) through the joystick control   []Safety Reset Switches []Used to change modes and stop the wheelchair when driving in latch mode    []Upgraded Electronics   []programming for accurate control []progressive Disease/changing condition []non-proportional drive control needed []Needed in order to operate power seat functions through joystick control   []Display box []Allows user to see in which mode and drive the wheelchair is set  []necessary for alternate controls    []Digital interface electronics []Allows w/c to operate when using alternative drive controls  []ASL Head Array []Allows client to operate wheelchair  through switches placed in tri-panel headrest  []Sip and puff with tubing kit []needed to operate sip and puff drive controls  []Upgraded tracking electronics []increase safety when driving []correct tracking when on uneven surfaces  [  x]Mount for switches or joystick []Attaches switches to w/c  [x]Swing away for access or transfers []midline for optimal placement []provides for consistent access  []Attendant controlled joystick plus mount []safety []long distance driving []operation of seat functions []compliance with transportation regulations []                                            Rear wheel placement/Axle adjustability []None []semi adjustable []fully adjustable  []improved UE access to wheels []improved stability []changing angle in space for improvement of postural stability []1-arm drive access []amputee pad placement []                                Wheel rims/ hand rims  []metal   []plastic coated []oblique projections           []vertical projections []Provide ability to propel manual wheelchair  [] Increase self-propulsion with hand weakness/decreased grasp  Push handles []extended   []angle adjustable              []standard []caregiver access []caregiver assist []allows "hooking" to enable increased ability to perform ADLs or maintain balance  One armed device   []Lt   []Rt []enable propulsion of manual wheelchair with one arm   []                                           Brake/wheel lock extension [] Lt   [] Rt []increase indep in applying wheel locks   []Side guards []prevent clothing getting caught in wheel or becoming soiled [] prevent skin tears/abrasions  Battery:     [id number]                                       [x]to power wheelchair                                                         Other:                                                                                                                        The above equipment has a life- long use expectancy. Growth and changes in medical and/or functional conditions would be the exceptions. This is to certify that the therapist has no financial relationship with durable medical provider or manufacturer. The therapist will not receive remuneration of any kind for the equipment recommended in this evaluation.  Patient has mobility limitation that significantly impairs safe, timely participation in one or more mobility related ADL's. (bathing, toileting, feeding, dressing, grooming, moving from room to room)  [x] Yes [] No  Will mobility device sufficiently improve ability to participate and/or be aided in participation of MRADL's?      [x] Yes [] No  Can limitation be compensated for with use of a cane or walker?                                    [] Yes [x] No  Does patient or caregiver demonstrate ability/potential ability & willingness to safely use  the mobility device?    [x] Yes [] No  Does patient's home environment support use of recommended mobility device?            [x] Yes [] No  Does patient have sufficient upper extremity function necessary to functionally propel a manual wheelchair?     [] Yes [x] No  Does patient have sufficient strength and trunk stability to safely operate a POV (scooter)?                                  [] Yes [x] No  Does patient need additional features/benefits provided by a power wheelchair for MRADL's in the home?        [x] Yes [] No  Does the patient demonstrate the ability to safely use a power wheelchair?                   [x] Yes [] No     Physician's Name Printed:                                                        68 Signature:  Date:     This is to certify that I, the above signed therapist have the following affiliations: [] This DME provider [] Manufacturer of recommended equipment [] Patient's long term care facility [x] None of the above  Therapist Name/Signature: Ricard Dillon PT, DPT                                           Date: 07/16/2021      [facility] PT Assessment - 07/16/21 0956       Assessment   Medical Diagnosis B AKA and hx of CVA    Referring Provider (PT) Elba Barman, MD    Onset Date/Surgical Date --   Pt reports CVA followed AKA while in hospital 9-10 years ago   Hand Dominance Right    Prior Therapy yes      Precautions   Precautions Fall      Restrictions   Weight Bearing Restrictions No   pt does not safe user of prosthesis, does not have functional prosthesis     Balance Screen   Has the patient fallen in the past 6 months Yes    How many times? 4    Has the patient had a decrease in activity level because of a fear of falling?  Yes      [place] Private residence    Living Arrangements Spouse/significant other    Available Help at Discharge Family    Type of [place]  entrance   ramp in front of the [place] One level    Home Equipment Wheelchair - power;Wheelchair - manual;Bedside commode      Prior Function   Level of Independence Requires assistive device for independence   now has difficulty   Vocation On disability                        Objective measurements completed on examination: See above findings.               PT Education - 07/16/21 1800     Education Details Pt educated on assessment for power WC    Person(s) Educated Patient;Spouse    Methods Explanation    Comprehension Verbalized understanding                 PT Long Term Goals - 07/16/21 1824       PT LONG TERM GOAL #1   Title Pt and caregivers will understand PT recommendation and appropriate/safe use of wheelchair and seating for home use.    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 Pt has not stood or ambulated in 2-3 years, and reports decline in strength and endurance. Pt with previous power wheelchair that he was successfully using to safely navigate his home up until 8 months ago when his power chair broke down d/t Tour manager failure. Pt now uses manual wheelchair, but his wife must help "push" him throughout their home. The manual wheelchair has also proven to be an unsafe option for the pt, as the pt has insufficient UE strength/endurance and reports LUE pain with trying to propel the manual wheelchair in his home. Manual wheelchair does not provide enough safety features, as pt also has impaired seated balance and shows increased reliance on UEs to maintain balance in his WC with testing. He has fallen 4 times in the last 6 months from the manual wheelchair when attempting transfers, limiting his  ability to safely perform ADLs. Overall, he has insufficient upper body strength for proper pressure-relief techniques in the manual WC. This has resulted in recurring wounds developing on his sacrum, placing him at increased risk for hospitalization and poor outcomes. Due to difficulty with propelling the manual WC, pt has been having difficulty making it to his commode on time and reports accidents of bowel and bladder, which also increases risk for skin wounds, infection and future hospitalization. He reports he did not have this problem when he had a power WC as he was able to make it to his commode on time and safely transfer. D/t difficulty with pressure-relief techniques pt has pain on his bottom which limits his time he can use his current manual wheelchair. Poor UE endurance and LUE elbow pain also limit his ability to propel his WC. Due to the  nature of the pt's condition, hx of multiple falls from his manual WC, poor overall strength, endurance, hx of wounds, and pain, the pt will require a power chair.    Personal Factors and Comorbidities Age;Comorbidity 1;Comorbidity 2;Comorbidity 3+;Fitness;Time since onset of injury/illness/exacerbation;Transportation;Past/Current Experience    Comorbidities B AKA, CVA with LHP, other PMH includes HTN, hypercholesterolemia, pulmonary embolus (2010), PVD, glaucoma with hx of retinal detachment (2013), shoulder pain, rhabdo (2018), crack cocaine use, and acute renal insufficiency.    Examination-Activity Limitations Bathing;Bed Mobility;Bend;Caring for Others;Continence;Hygiene/Grooming;Locomotion Level;Self Feeding;Sit;Squat;Stairs;Stand;Toileting;Transfers    Examination-Participation Restrictions Driving;Medication Management;Cleaning;Meal Prep;Yard Work;Community Activity;Laundry;Shop    Stability/Clinical Decision Making Evolving/Moderate complexity    Clinical Decision Making Moderate    PT Frequency One time visit    PT Duration --   one time visit   PT  Treatment/Interventions ADLs/Self Care Home Management;Wheelchair mobility training    PT Next Visit Plan wheelchair eval only    PT Home Exercise Plan WC eval only    Consulted and Agree with Plan of Care Patient;Family member/caregiver    Family Member Consulted Pt's wife present at eval             Patient will benefit from skilled therapeutic intervention in order to improve the following deficits and impairments:  Decreased activity tolerance, Decreased endurance, Decreased mobility, Decreased balance, Decreased strength, Hypomobility, Postural dysfunction  Visit Diagnosis: Other abnormalities of gait and mobility  Muscle weakness (generalized)  Unsteadiness on feet  S/P bilateral above knee amputation [facility])     Problem List Patient Active Problem List   Diagnosis Date Noted   Personal history of tobacco use, presenting hazards to health 03/31/2016   Ricard Dillon PT, DPT 07/17/2021, 10:25 AM  [place], [place], [address] Phone: [phone]   Fax:  [phone]  Name: Derek Gilbert MRN: [id number] Date of Birth: 06-Jul-1951

## 2023-12-22 ENCOUNTER — Other Ambulatory Visit: Payer: Self-pay

## 2023-12-22 ENCOUNTER — Emergency Department: Payer: Medicare (Managed Care)

## 2023-12-22 ENCOUNTER — Emergency Department
Admission: EM | Admit: 2023-12-22 | Discharge: 2023-12-23 | Disposition: A | Discharge status: Home / Self Care | Payer: Medicare (Managed Care) | Attending: Emergency Medicine | Admitting: Emergency Medicine

## 2023-12-22 DIAGNOSIS — S0990XA Unspecified injury of head, initial encounter: Secondary | ICD-10-CM | POA: Insufficient documentation

## 2023-12-22 DIAGNOSIS — W19XXXA Unspecified fall, initial encounter: Secondary | ICD-10-CM

## 2023-12-22 DIAGNOSIS — W06XXXA Fall from bed, initial encounter: Secondary | ICD-10-CM | POA: Diagnosis not present

## 2023-12-22 MED ORDER — ACETAMINOPHEN 325 MG PO TABS
650.0000 mg | ORAL_TABLET | Freq: Once | ORAL | Status: AC
  Administered 2023-12-22: 650 mg via ORAL
  Filled 2023-12-22: qty 2

## 2023-12-22 NOTE — ED Triage Notes (Signed)
Refer to First Nurse note.

## 2023-12-22 NOTE — ED Provider Triage Note (Signed)
Emergency Medicine Provider Triage Evaluation Note  Derek Gilbert , a 73 y.o. male  was evaluated in triage.  Pt per EMS is brought for evaluation after patient fell out of his bed at [facility].  Bed reported at 2 feet off the floor.  Patient is + Flu.   Orientation at baseline and patient denies any injury.    Review of Systems  Positive: Bodyaches  + flu Negative: afebrile  Physical Exam  There were no vitals taken for this visit. Gen:   Awake, no distress   alert, answers simple questions Resp:  Normal effort   Lungs clear bilat.  Heart RRR  MSK:   Moves extremities without difficulty    Pt is aka bilateral amputee Other:    Medical Decision Making  Medically screening exam initiated at 11:45 AM.  Appropriate orders placed.  Timathy Newberry was informed that the remainder of the evaluation will be completed by another provider, this initial triage assessment does not replace that evaluation, and the importance of remaining in the ED until their evaluation is complete.     Tommi Rumps, PA-C 12/22/23 1157

## 2023-12-22 NOTE — ED Triage Notes (Signed)
First nurse note: Arrived by [facility] from [facility]. Reports patient fell out bed. Double amputee legs. Flu +  EMS vitals: 111HR 97%RA 127/91 b/p 24RR  Staff reported to EMS patient is at baseline orientation at this time

## 2023-12-22 NOTE — Discharge Instructions (Signed)
You are seen in the emergency department after you had a fall out of bed.  You are having a headache and given Tylenol.  You had a CT scan your head that did not show any signs of bleeding or broken bones.  You can take Tylenol as needed for pain control.  Return if you have any new pain or worsening symptoms.

## 2023-12-22 NOTE — ED Provider Notes (Signed)
[facility] Provider Note    Event Date/Time   First MD Initiated Contact with Patient 12/22/23 1708     (approximate)   History   Fall   HPI  Derek Gilbert is a 73 y.o. male past medical history significant for bilateral AKA who presents to the emergency department following a fall.  Patient states that he fell whenever he was reaching for a pillow and fell out of bed.  Believes he hit his head.  Mild headache earlier today but denies any headache at this time.  Denies any chest pain or shortness of breath.  No neck or back pain.  No pain in his extremities.     Physical Exam   Triage Vital Signs: ED Triage Vitals  Encounter Vitals Group     BP 12/22/23 1150 106/76     Systolic BP Percentile --      Diastolic BP Percentile --      Pulse Rate 12/22/23 1150 (!) 108     Resp 12/22/23 1150 20     Temp 12/22/23 1150 98.1 F (36.7 C)     Temp Source 12/22/23 1150 Oral     SpO2 12/22/23 1150 99 %     Weight 12/22/23 1147 134 lb (60.8 kg)     Height --      Head Circumference --      Peak Flow --      Pain Score 12/22/23 1147 0     Pain Loc --      Pain Education --      Exclude from Growth Chart --     Most recent vital signs: Vitals:   12/22/23 1150  BP: 106/76  Pulse: (!) 108  Resp: 20  Temp: 98.1 F (36.7 C)  SpO2: 99%    Physical Exam Constitutional:      Appearance: He is well-developed.  HENT:     Head: Atraumatic.     Right Ear: External ear normal.     Left Ear: External ear normal.  Eyes:     Extraocular Movements: Extraocular movements intact.     Conjunctiva/sclera: Conjunctivae normal.     Pupils: Pupils are equal, round, and reactive to light.  Cardiovascular:     Rate and Rhythm: Normal rate and regular rhythm.  Pulmonary:     Effort: Pulmonary effort is normal. No respiratory distress.  Abdominal:     General: There is no distension.  Musculoskeletal:        General: Normal range of motion.     Cervical  back: Normal range of motion. No tenderness.     Comments: Bilateral AKA with no tenderness to palpation.  Wounds that appears to be well-healing to the left AKA.  No midline thoracic or lumbar tenderness to palpation  Skin:    General: Skin is warm.  Neurological:     Mental Status: He is alert. Mental status is at baseline.      IMPRESSION / MDM / ASSESSMENT AND PLAN / ED COURSE  I reviewed the triage vital signs and the nursing notes.  Differential diagnosis including intracranial hemorrhage, fracture, musculoskeletal strain.  On arrival mild tachycardia and afebrile.   RADIOLOGY I independently reviewed imaging, my interpretation of imaging: CT scan of head without signs of intracranial hemorrhage or infarction.   Labs (all labs ordered are listed, but only abnormal results are displayed) Labs interpreted as -    Labs Reviewed - No data to display  Patient is otherwise well-appearing.  Patient is currently afebrile but has tested positive for influenza.  CT scan of the head without signs of intracranial hemorrhage or infarction.  No concern for basilar skull fracture.  No midline cervical spine tenderness do not feel that the patient needs a CT scan of the cervical spine.  No other acute traumatic injury.  Discussed symptomatic treatment and return precautions.  Discussed follow-up with primary care physician for wound reevaluation.     PROCEDURES:  Critical Care performed: No  Procedures  Patient's presentation is most consistent with acute complicated illness / injury requiring diagnostic workup.   MEDICATIONS ORDERED IN ED: Medications  acetaminophen (TYLENOL) tablet 650 mg (has no administration in time range)    FINAL CLINICAL IMPRESSION(S) / ED DIAGNOSES   Final diagnoses:  Fall, initial encounter  Injury of head, initial encounter     Rx / DC Orders   ED Discharge Orders     None        Note:  This document was prepared using Dragon voice  recognition software and may include unintentional dictation errors.   Corena Herter, MD 12/22/23 1729

## 2023-12-22 NOTE — ED Notes (Signed)
Called [facility] spoke with rep. Richard he stated pt is next on the list to be picked up.

## 2023-12-24 ENCOUNTER — Emergency Department
Admission: EM | Admit: 2023-12-24 | Discharge: 2023-12-24 | Disposition: A | Discharge status: Skilled Nursing Facility | Payer: Medicare (Managed Care) | Attending: Emergency Medicine | Admitting: Emergency Medicine

## 2023-12-24 ENCOUNTER — Other Ambulatory Visit: Payer: Self-pay

## 2023-12-24 ENCOUNTER — Emergency Department: Payer: Medicare (Managed Care)

## 2023-12-24 DIAGNOSIS — W19XXXA Unspecified fall, initial encounter: Secondary | ICD-10-CM | POA: Diagnosis not present

## 2023-12-24 DIAGNOSIS — S0990XA Unspecified injury of head, initial encounter: Secondary | ICD-10-CM | POA: Diagnosis present

## 2023-12-24 HISTORY — DX: Essential (primary) hypertension: I10

## 2023-12-24 MED ORDER — ACETAMINOPHEN 500 MG PO TABS
1000.0000 mg | ORAL_TABLET | Freq: Once | ORAL | Status: AC
  Administered 2023-12-24: 1000 mg via ORAL
  Filled 2023-12-24: qty 2

## 2023-12-24 NOTE — ED Triage Notes (Signed)
Patient arrives via EMS C/O unwitnessed fall out of bed. He is C/O head pain and left leg pain (he has an ulcer on that leg). Patient is flu positive. He is also on xerelto.

## 2023-12-24 NOTE — ED Notes (Signed)
Pt removed brief (which was dry) then called on call bell for RN to come place a new brief on him. New brief placed on pt. Also snacks provided. No sandwich trays available.

## 2023-12-24 NOTE — ED Provider Notes (Signed)
[facility] Provider Note    Event Date/Time   First MD Initiated Contact with Patient 12/24/23 [phone]     (approximate)   History   Fall   HPI Derek Gilbert is a 73 y.o. male with a recent diagnosis of influenza confirmed by his skilled nursing facility.  He is sent by EMS after an unwitnessed fall.  The patient has bilateral leg amputations and was sleeping on a bed that is very close to the floor.  However, he was trying to get up and fell and bumped the back of his head on the ground.  He did not lose consciousness.  He has no other injuries but states that his head hurts a little bit.  He denies neck pain, chest pain, shortness of breath, nausea, vomiting, and abdominal pain.  He has no pain in his arms.  He reportedly takes a blood thinner.     Physical Exam   Triage Vital Signs: ED Triage Vitals  Encounter Vitals Group     BP 12/24/23 0627 107/84     Systolic BP Percentile --      Diastolic BP Percentile --      Pulse Rate 12/24/23 0625 (!) 118     Resp 12/24/23 0627 18     Temp 12/24/23 0625 98 F (36.7 C)     Temp Source 12/24/23 0625 Oral     SpO2 12/24/23 0625 94 %     Weight 12/24/23 0624 57.9 kg (127 lb 10.3 oz)     Height --      Head Circumference --      Peak Flow --      Pain Score --      Pain Loc --      Pain Education --      Exclude from Growth Chart --     Most recent vital signs: Vitals:   12/24/23 0625 12/24/23 0627  BP:  107/84  Pulse: (!) 118   Resp:  18  Temp: 98 F (36.7 C)   SpO2: 94% 95%    General: Awake, no distress.  Head:  No palpable deformities.  Patient reports some occipital tenderness to palpation but there is no contusion or hematoma at this time. CV:  Good peripheral perfusion.  Resp:  Normal effort. Speaking easily and comfortably, no accessory muscle usage nor intercostal retractions.  Lungs clear to auscultation. Abd:  No distention.  No tenderness to palpation of the abdomen. MSK:  No  tenderness to palpation along the cervical spine and no reported neck pain.  No pain or tenderness with passive and active range of motion of both of his arms.  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   ED Results / Procedures / Treatments   Labs (all labs ordered are listed, but only abnormal results are displayed) Labs Reviewed - No data to display     RADIOLOGY I viewed and interpreted the patient's head CT and cervical spine CT.  There is no evidence of acute intracranial hemorrhage and no evidence of cervical spine fracture.  The radiologist confirmed no acute findings but also mentions some pulmonary nodules worthy of outpatient follow-up.   PROCEDURES:  Critical Care performed: No  Procedures    IMPRESSION / MDM / ASSESSMENT AND PLAN / ED COURSE  I reviewed the triage vital signs and the nursing notes.  Differential diagnosis includes, but is not limited to, contusion, fracture, listhesis, C-spine injury, intracranial hemorrhage.  Patient's presentation is most consistent with acute presentation with potential threat to life or bodily function.  Labs/studies ordered: Head CT, cervical spine CT  Interventions/Medications given:  Medications  acetaminophen (TYLENOL) tablet 1,000 mg (1,000 mg Oral Given 12/24/23 0756)    (Note:  hospital course my include additional interventions and/or labs/studies not listed above.)   The patient is tachycardic but he has influenza.  He is in no distress and has no symptoms other than mild headache.  CT scans normal, physical exam very reassuring.  Patient describes a mechanical fall when trying to get out of bed.  No indication for additional evaluation.  Medical screening exam is reassuring that there is no emergent medical condition at this time and the patient is appropriate for discharge back to his facility.         FINAL CLINICAL IMPRESSION(S) / ED DIAGNOSES   Final  diagnoses:  Fall, initial encounter  Minor head injury, initial encounter     Rx / DC Orders   ED Discharge Orders          Ordered    AMB  Referral to Pulmonary Nodule Clinic        12/24/23 0726             Note:  This document was prepared using Dragon voice recognition software and may include unintentional dictation errors.   Loleta Rose, MD 12/24/23 [phone]

## 2023-12-24 NOTE — Discharge Instructions (Signed)
You have been seen in the Emergency Department (ED) today for a fall.  Your work up does not show any concerning injuries.  Please take over-the-counter ibuprofen and/or Tylenol as needed for your pain (unless you have an allergy or your doctor as told you not to take them), or take any prescribed medication as instructed.  Please follow up with your doctor regarding today's Emergency Department (ED) visit and your recent fall.    Of note, there was an incidental finding of a pulmonary nodule on your CT scan.  This is most likely nothing to worry about, but it is worth following up as an outpatient.  We referred you to the pulmonary nodule clinic and someone should reach out to you to help schedule a follow-up appointment.  Return to the ED if you have any headache, confusion, slurred speech, weakness/numbness of any arm or leg, or any increased pain.

## 2023-12-29 ENCOUNTER — Encounter: Payer: Self-pay | Admitting: Pulmonary Disease

## 2024-01-12 ENCOUNTER — Encounter: Payer: Self-pay | Admitting: Pulmonary Disease

## 2024-01-20 DEATH — deceased
